# Patient Record
Sex: Male | Born: 1950 | Race: White | Hispanic: No | Marital: Married | Smoking: Never smoker
Health system: Southern US, Community
[De-identification: ages and names within clinical notes are randomized; demographics above are authoritative.]

## PROBLEM LIST (undated history)

## (undated) DIAGNOSIS — E7800 Pure hypercholesterolemia, unspecified: Secondary | ICD-10-CM

## (undated) DIAGNOSIS — C801 Malignant (primary) neoplasm, unspecified: Secondary | ICD-10-CM

## (undated) HISTORY — DX: Pure hypercholesterolemia, unspecified: E78.00

## (undated) HISTORY — DX: Malignant (primary) neoplasm, unspecified: C80.1

---

## 2009-09-17 ENCOUNTER — Encounter: Admission: RE | Admit: 2009-09-17 | Discharge: 2009-09-17 | Payer: Self-pay | Admitting: Family Medicine

## 2011-04-28 IMAGING — CR DG SACRUM/COCCYX 2+V
2 series · 2 of 2 positions shown · non-contrast
Comparison: None

CLINICAL DATA: The patient fell and has pain over coccyx

SACRUM AND COCCYX - 2+ VIEW

[view not recorded (1 of 2)]
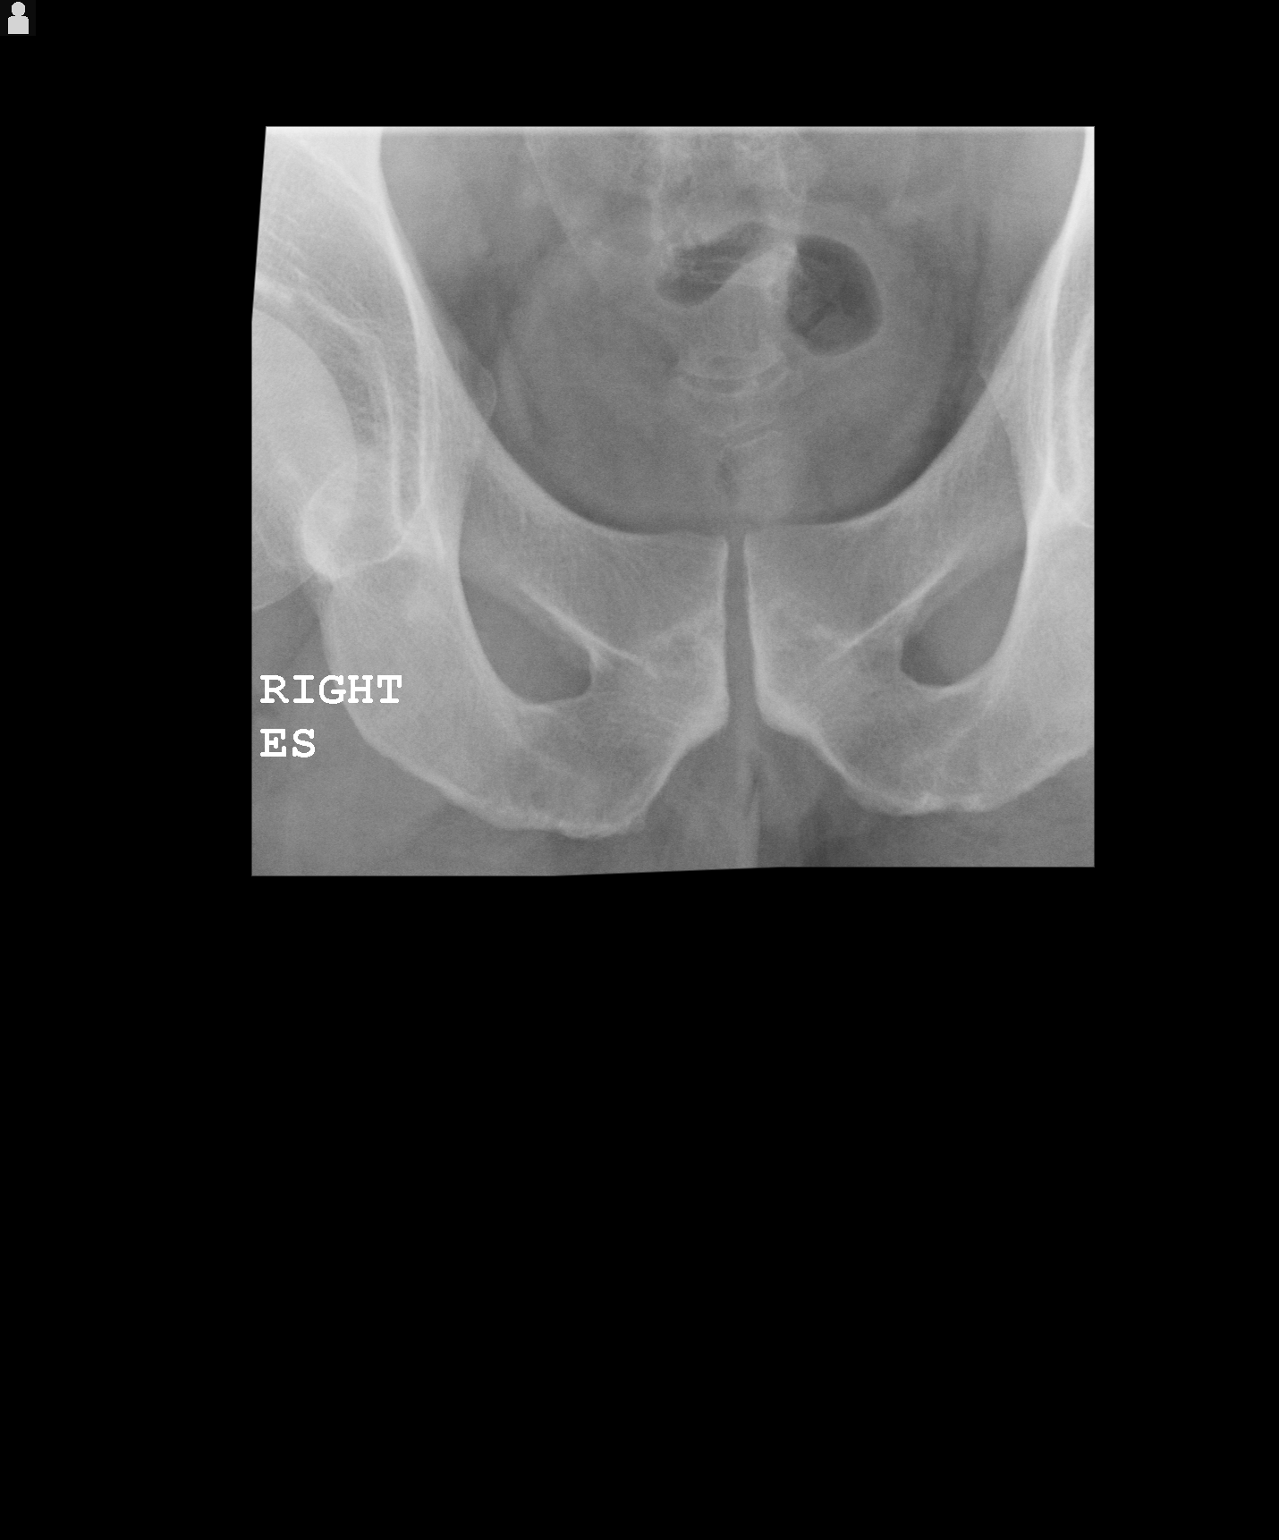

[view not recorded (2 of 2)]
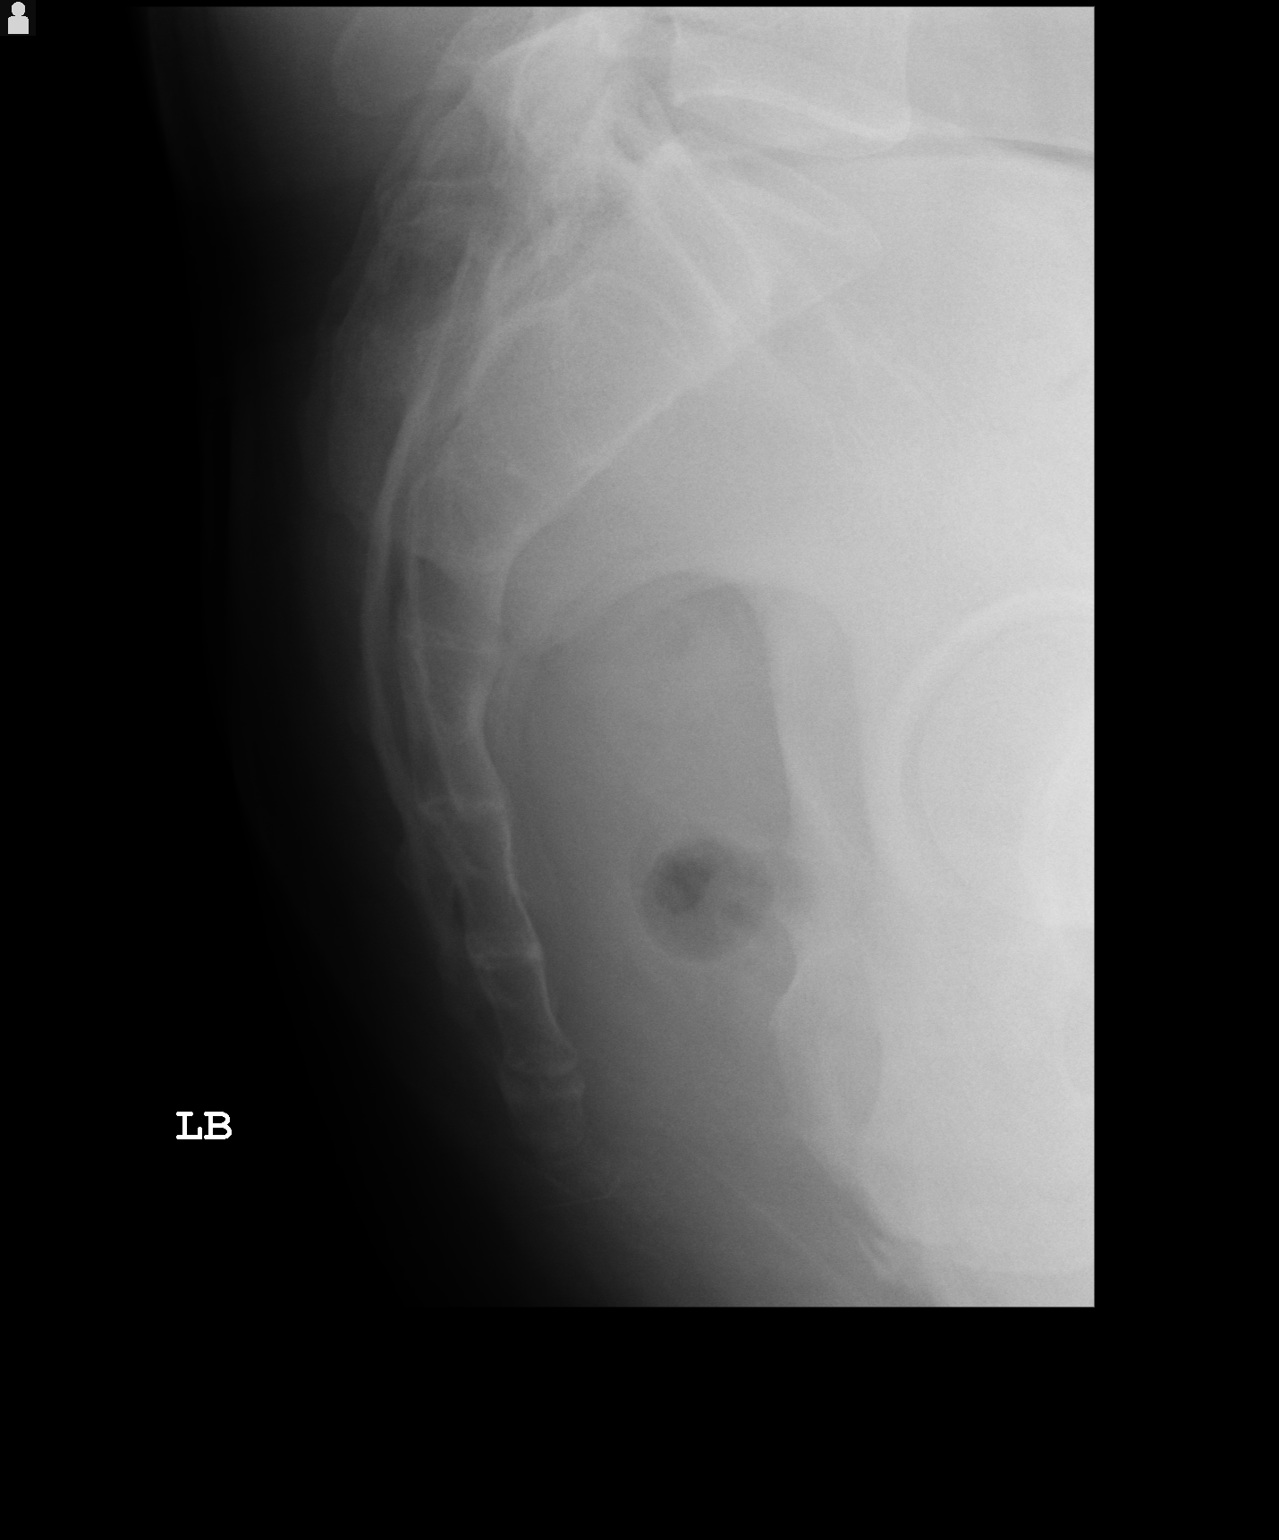

[2 of 2 positions shown; findings below may reference images not displayed]

FINDINGS: The sacrum and coccyx are intact.
IMPRESSION: Negative.

## 2011-07-23 ENCOUNTER — Encounter: Payer: Self-pay | Admitting: Gastroenterology

## 2013-09-10 ENCOUNTER — Telehealth: Payer: Self-pay | Admitting: Neurology

## 2013-09-11 ENCOUNTER — Encounter: Payer: Self-pay | Admitting: Neurology

## 2013-09-12 ENCOUNTER — Ambulatory Visit (INDEPENDENT_AMBULATORY_CARE_PROVIDER_SITE_OTHER): Payer: PRIVATE HEALTH INSURANCE | Admitting: Neurology

## 2013-09-12 ENCOUNTER — Encounter (INDEPENDENT_AMBULATORY_CARE_PROVIDER_SITE_OTHER): Payer: Self-pay

## 2013-09-12 ENCOUNTER — Encounter: Payer: Self-pay | Admitting: Neurology

## 2013-09-12 ENCOUNTER — Ambulatory Visit: Payer: Self-pay | Admitting: Neurology

## 2013-09-12 VITALS — BP 123/81 | HR 55 | Ht 68.0 in | Wt 186.0 lb

## 2013-09-12 DIAGNOSIS — G500 Trigeminal neuralgia: Secondary | ICD-10-CM

## 2013-09-12 MED ORDER — OXCARBAZEPINE 150 MG PO TABS: 150.0000 mg | ORAL_TABLET | Freq: Three times a day (TID) | ORAL | Status: AC

## 2013-09-12 MED ORDER — GABAPENTIN 300 MG PO CAPS: 300.0000 mg | ORAL_CAPSULE | Freq: Three times a day (TID) | ORAL | Status: AC

## 2013-09-12 NOTE — Progress Notes (Signed)
History of Present Illness:   [name] is a 62 year old right-handed white married male, he was a patient of Dr. Sandria Manly  He had greater than 20 year history of right facial numbness now associated with recurrent right V3 distribution pain beginning in the lower jaw radiating toward the right ear first occuring in 2004.  He was evaluated in 09/18/2008 with normal neurologic examination, normal MRI of the brain wthout contrast, and negative intracranial MRA of the brain.  He did well until Christmas morning 2010 at 3 AM when he developed  recurrent pain, feeling like a "spike" in his right lower jaw,  6 nights in a row.  It wouldhappen as often as 6 times during the day lasting seconds. He noticed the spikes of pain occurring 52 times in  one hour. At one point there would occur every 2 minutes for 3 hours. His pain responded to carbamazepine. He was off medication from 01/2010-09/2010. His pain began recurring in the fall 2011, initially lasting 5-10 seconds that was severe, but would go up to 45 seconds. His symptoms are made worse by sneezing, coughing, movement, and brushing his teeth. He denies side effects from  oxcarbazepine except sleepiness. He currently takes 150 mg  every 4 hours for a  total of 5 per day. The longer he talks, the more his pain will occur. if If he takes too much oxcarbazeine,  his pain resolves,  but he feels like a zombie. He works as a [employer] Monday through Friday. He denies other neurologic symptoms.   He had recurrent pain again in 05/06/2012.  5/10 in the right V2 distribution lasting 30 minutes.   He began having recurrent right facial pain again in July 20 eighth 2014, shooting pain from right jaw to his right ear, short lasting, severe, responded well to Trileptal 150 3 times a day, and gabapentin 300 mg 3 times a day, he denies significant side effects from the medications,   Review of Systems  Out of a complete 14 system review, the patient complains of only  the following symptoms, and all other reviewed systems are negative.  Headaches, numbness, seizure,  Social History  He is a Naval architect. Lives at home with his wife. They do not have children. Has 1 1/2 to 2 liters of decaffeinated beverages a day. Rarely consumes alcohol. Denies tobacco and illict drug use. He is right handed. Inhaled Tobacco Use: never smoker  Family History  Mother died at age 6 from cancer. Father is living, positive for heart disease and cancer.  Past Medical History  Positive for cancer and high cholesterol. Allergies requiring allergy shots weekly   PHYSICAL EXAMINATOINS:  Generalized: In no acute distress  Neck: Supple, no carotid bruits   Cardiac: Regular rate rhythm  Pulmonary: Clear to auscultation bilaterally  Musculoskeletal: No deformity  Neurological examination  Mentation: Alert oriented to time, place, history taking, and causual conversation  Cranial nerve II-XII: Pupils were equal round reactive to light extraocular movements were full, visual field were full on confrontational test. facial sensation and strength were normal. hearing was intact to finger rubbing bilaterally. Uvula tongue midline.  head turning and shoulder shrug and were normal and symmetric.Tongue protrusion into cheek strength was normal.  Motor: normal tone, bulk and strength.  Sensory: Intact to fine touch, pinprick, preserved vibratory sensation, and proprioception at toes.  Coordination: Normal finger to nose, heel-to-shin bilaterally there was no truncal ataxia  Gait: Rising up from seated position without assistance, normal stance,  without trunk ataxia, moderate stride, good arm swing, smooth turning, able to perform tiptoe, and heel walking without difficulty.   Romberg signs: Negative  Deep tendon reflexes: Brachioradialis 2/2, biceps 2/2, triceps 2/2, patellar 2/2, Achilles 2/2, plantar responses were flexor bilaterally.   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

## 2013-12-03 ENCOUNTER — Telehealth: Payer: Self-pay | Admitting: Neurology

## 2013-12-03 NOTE — Telephone Encounter (Signed)
A one year supply of both medications were sent to [facility] in Nov.  I called the pahrmacy.  They verified they do have the Rx's on file.  I called the patient back.  He is aware and will follow up with the pharmacy.

## 2014-02-06 DEATH — deceased

## 2014-04-29 ENCOUNTER — Encounter: Payer: Self-pay | Admitting: Gastroenterology

## 2014-09-17 ENCOUNTER — Ambulatory Visit: Payer: PRIVATE HEALTH INSURANCE | Admitting: Neurology

## 2014-09-25 ENCOUNTER — Encounter: Payer: Self-pay | Admitting: Neurology

## 2015-09-04 NOTE — Telephone Encounter (Signed)
Error
